# Patient Record
Sex: Female | Born: 1950 | Race: White | Hispanic: No | Marital: Married | Smoking: Current every day smoker
Health system: Southern US, Community
[De-identification: ages and names within clinical notes are randomized; demographics above are authoritative.]

---

## 2016-11-29 ENCOUNTER — Encounter (HOSPITAL_BASED_OUTPATIENT_CLINIC_OR_DEPARTMENT_OTHER): Payer: Self-pay | Admitting: Emergency Medicine

## 2016-11-29 ENCOUNTER — Emergency Department (HOSPITAL_BASED_OUTPATIENT_CLINIC_OR_DEPARTMENT_OTHER)
Admission: EM | Admit: 2016-11-29 | Discharge: 2016-11-29 | Disposition: A | Discharge status: Home / Self Care | Payer: PRIVATE HEALTH INSURANCE | Attending: Emergency Medicine | Admitting: Emergency Medicine

## 2016-11-29 ENCOUNTER — Emergency Department (HOSPITAL_BASED_OUTPATIENT_CLINIC_OR_DEPARTMENT_OTHER): Payer: PRIVATE HEALTH INSURANCE

## 2016-11-29 DIAGNOSIS — M25552 Pain in left hip: Secondary | ICD-10-CM | POA: Diagnosis present

## 2016-11-29 DIAGNOSIS — F17200 Nicotine dependence, unspecified, uncomplicated: Secondary | ICD-10-CM | POA: Insufficient documentation

## 2016-11-29 DIAGNOSIS — M5432 Sciatica, left side: Secondary | ICD-10-CM | POA: Insufficient documentation

## 2016-11-29 MED ORDER — PREDNISONE 10 MG PO TABS: 40.0000 mg | ORAL_TABLET | Freq: Every day | ORAL | 0 refills | Status: AC

## 2016-11-29 MED ORDER — NAPROXEN 250 MG PO TABS
375.0000 mg | ORAL_TABLET | Freq: Once | ORAL | Status: AC
  Administered 2016-11-29: 375 mg via ORAL
  Filled 2016-11-29: qty 2

## 2016-11-29 MED ORDER — PREDNISONE 20 MG PO TABS
40.0000 mg | ORAL_TABLET | Freq: Once | ORAL | Status: AC
  Administered 2016-11-29: 40 mg via ORAL
  Filled 2016-11-29: qty 2

## 2016-11-29 MED ORDER — NAPROXEN 375 MG PO TABS: 375.0000 mg | ORAL_TABLET | Freq: Two times a day (BID) | ORAL | 0 refills | Status: AC

## 2016-11-29 NOTE — ED Triage Notes (Signed)
Pt c/o left hip pain. Pt states she felt some pain in that hip earlier today at work and it was significantly worse when trying to get out of her car tonight.

## 2016-11-29 NOTE — ED Provider Notes (Signed)
[facility]   CSN: [id number] Arrival date & time: 11/29/16  2012  By signing my name below, I, Freida Busmaniana Omoyeni, attest that this documentation has been prepared under the direction and in the presence of Tilden FossaElizabeth Anan Dapolito, MD . Electronically Signed: Freida Busmaniana Omoyeni, Scribe. 11/29/2016. 10:48 PM   History   Chief Complaint Chief Complaint  Patient presents with  . Hip Pain    The history is provided by the patient. No language interpreter was used.     HPI Comments:  Tasha Cordova is a 66 y.o. female who presents to the Emergency Department complaining of pain to the left hip region which began this afternoon. She notes gradual onset while at work and states after almost 40 minute drive home from work today her pain significantly worsened. Pt denies injury. She also denies abdominal pain, nausea, vomiting, hematuria, and dysuria. No h/o similar pain.    No PCP  History reviewed. No pertinent past medical history.  There are no active problems to display for this patient.   History reviewed. No pertinent surgical history.  OB History    No data available       Home Medications    Prior to Admission medications   Medication Sig Start Date End Date Taking? Authorizing Provider  naproxen (NAPROSYN) 375 MG tablet Take 1 tablet (375 mg total) by mouth 2 (two) times daily. 11/29/16   Tilden FossaElizabeth Davyd Podgorski, MD  predniSONE (DELTASONE) 10 MG tablet Take 4 tablets (40 mg total) by mouth daily. 11/29/16   Tilden FossaElizabeth Kymberlyn Eckford, MD    Family History No family history on file.  Social History Social History  Substance Use Topics  . Smoking status: Current Every Day Smoker  . Smokeless tobacco: Never Used  . Alcohol use Yes     Allergies   Azo [phenazopyridine]   Review of Systems Review of Systems  Gastrointestinal: Negative for abdominal pain, nausea and vomiting.  Genitourinary: Negative for dysuria and hematuria.  Musculoskeletal: Positive for arthralgias  and myalgias.     Physical Exam Updated Vital Signs BP (!) 145/62 (BP Location: Right Arm)   Pulse 81   Temp 98.5 F (36.9 C) (Oral)   Resp 18   Ht 5' 4.5" (1.638 m)   Wt 133 lb (60.3 kg)   SpO2 98%   BMI 22.48 kg/m   Physical Exam  Constitutional: She is oriented to person, place, and time. She appears well-developed and well-nourished.  HENT:  Head: Normocephalic and atraumatic.  Cardiovascular: Normal rate and regular rhythm.   No murmur heard. Pulmonary/Chest: Effort normal and breath sounds normal. No respiratory distress.  Abdominal: Soft. There is no tenderness. There is no rebound and no guarding.  Musculoskeletal: She exhibits no edema or tenderness.  2+ DP pulses bilaterally. There is mild tenderness over the left SI joint. Pain is reproducible with extension of the left lower extremity and radiates to the left posterior knee.  Neurological: She is alert and oriented to person, place, and time.  5/5 strength in BLE with sensation to light touch intact in BLE.    Skin: Skin is warm and dry.  Psychiatric: She has a normal mood and affect. Her behavior is normal.  Nursing note and vitals reviewed.    ED Treatments / Results  DIAGNOSTIC STUDIES:  Oxygen Saturation is 98% on RA, normal by my interpretation.    COORDINATION OF CARE:  10:48 PM Discussed treatment plan with pt at bedside and pt agreed to plan.  Labs (all  labs ordered are listed, but only abnormal results are displayed) Labs Reviewed - No data to display  EKG  EKG Interpretation None       Radiology Dg Hip Unilat W Or Wo Pelvis 2-3 Views Left  Result Date: 11/29/2016 CLINICAL DATA:  Left hip pain, no known injury, initial encounter EXAM: DG HIP (WITH OR WITHOUT PELVIS) 3V LEFT COMPARISON:  None. FINDINGS: Pelvic ring is intact. No acute fracture or dislocation is noted. No soft tissue abnormality is seen. IMPRESSION: No acute abnormality noted. Electronically Signed   By: Alcide Clever M.D.    On: 11/29/2016 21:06    Procedures Procedures (including critical care time)  Medications Ordered in ED Medications  naproxen (NAPROSYN) tablet 375 mg (not administered)  predniSONE (DELTASONE) tablet 40 mg (40 mg Oral Given 11/29/16 2301)     Initial Impression / Assessment and Plan / ED Course  I have reviewed the triage vital signs and the nursing notes.  Pertinent labs & imaging results that were available during my care of the patient were reviewed by me and considered in my medical decision making (see chart for details).     Patient here for evaluation of atraumatic left hip pain. Pain does radiate down to her posterior knee with range of motion. Pain is reproducible on examination.  No red flag symptoms. Presentation is not consistent with dissection, epidural abscess, cauda equina, septic arthritis. Counseled pt on home care for sciatica with rest, PCP follow-up and return precautions.  Final Clinical Impressions(s) / ED Diagnoses   Final diagnoses:  Sciatica of left side    New Prescriptions New Prescriptions   NAPROXEN (NAPROSYN) 375 MG TABLET    Take 1 tablet (375 mg total) by mouth 2 (two) times daily.   PREDNISONE (DELTASONE) 10 MG TABLET    Take 4 tablets (40 mg total) by mouth daily.   I personally performed the services described in this documentation, which was scribed in my presence. The recorded information has been reviewed and is accurate.    Tilden Fossa, MD 11/29/16 2302

## 2016-11-29 NOTE — ED Triage Notes (Signed)
Called to triage with no answer from lobby.  

## 2017-09-03 IMAGING — DX DG HIP (WITH OR WITHOUT PELVIS) 2-3V*L*
3 series · 3 of 3 positions shown · non-contrast
Comparison: None.

CLINICAL DATA: Left hip pain, no known injury, initial encounter

EXAM:
DG HIP (WITH OR WITHOUT PELVIS) 3V LEFT

[pelvis ap]
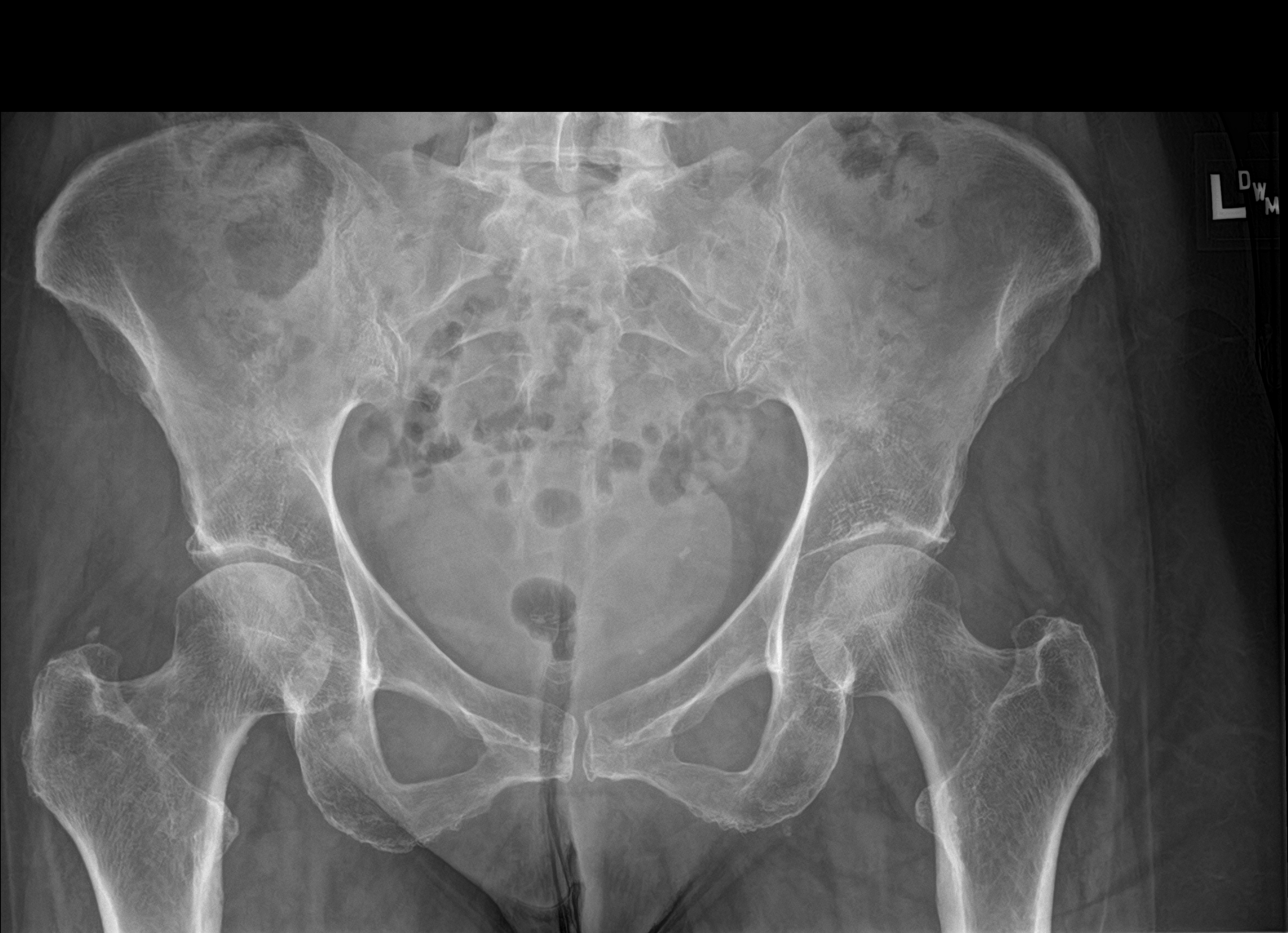

[hip ap]
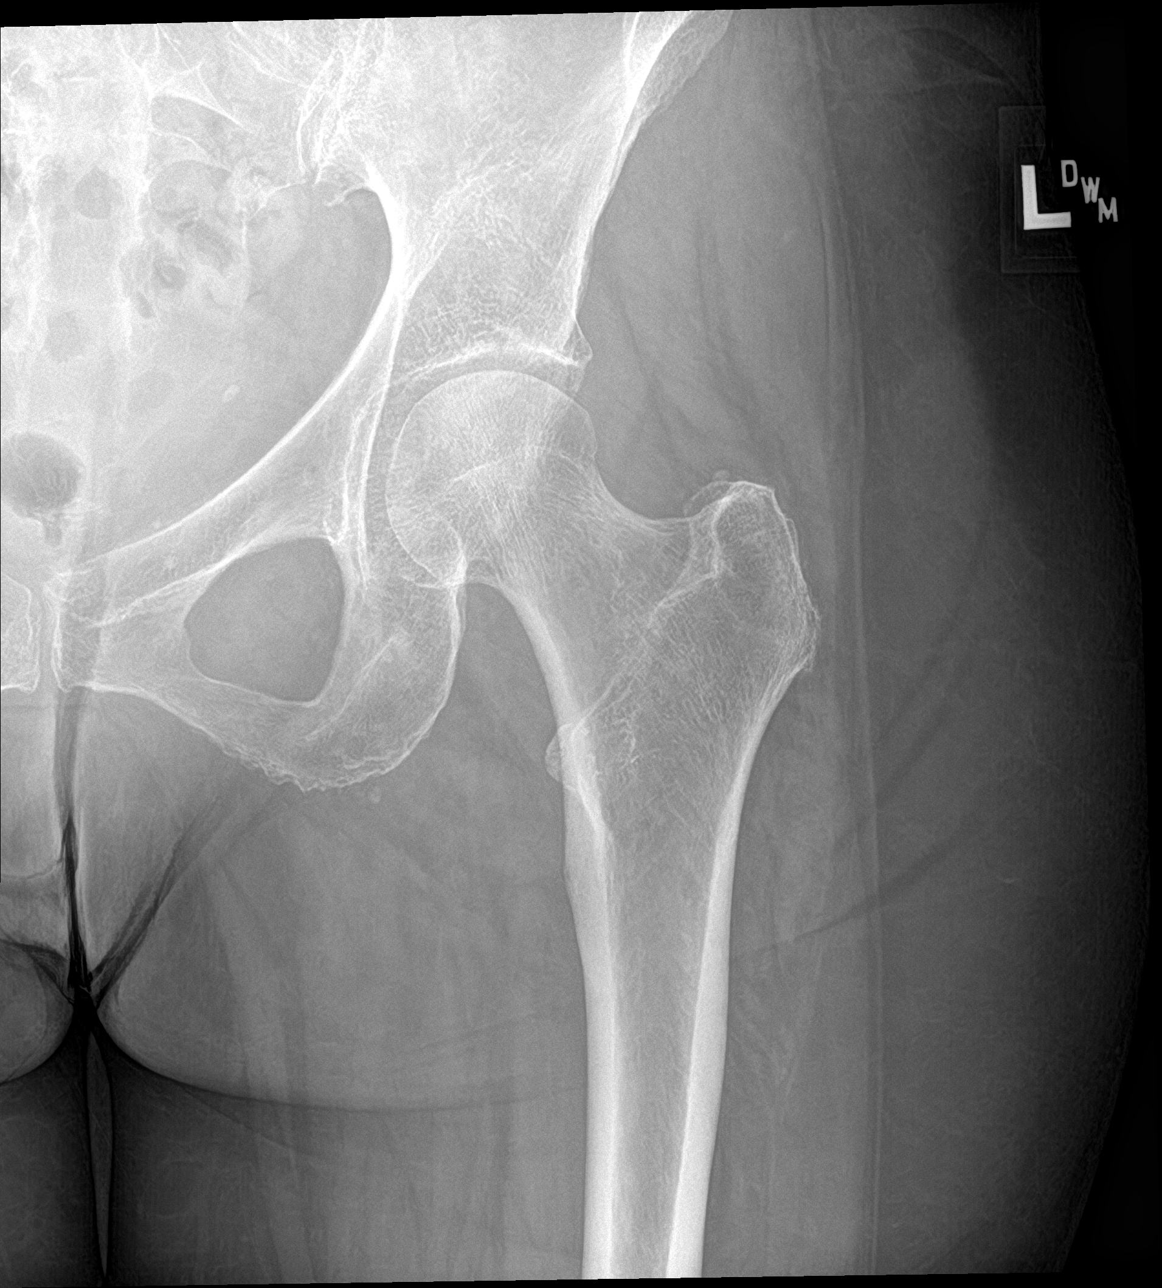

[hip lat]
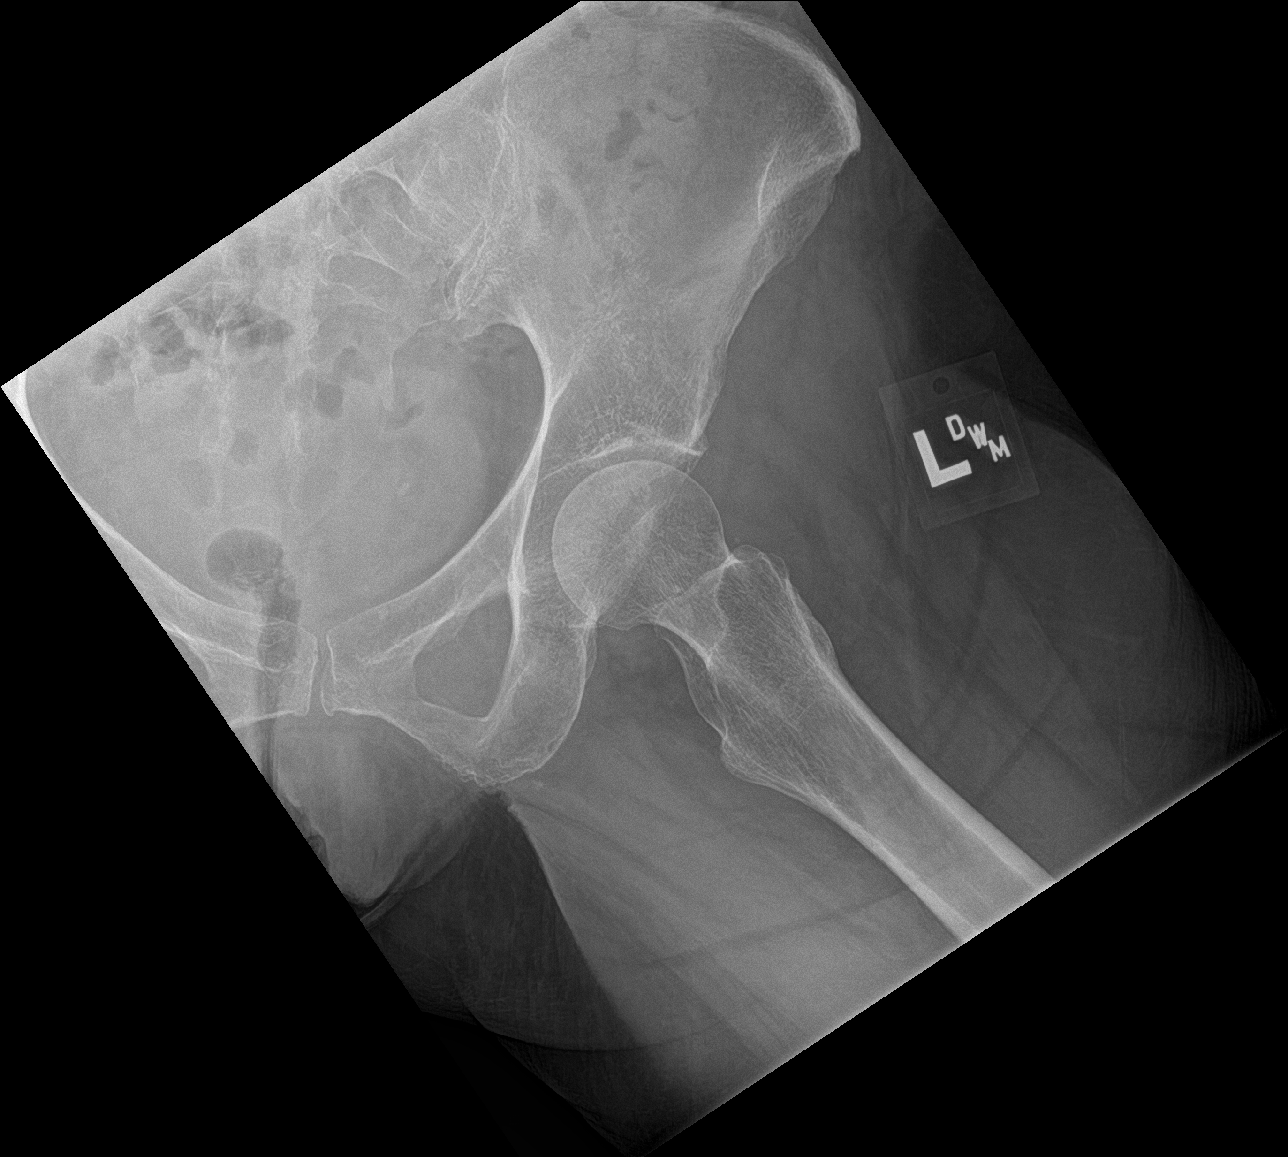

[3 of 3 positions shown; findings below may reference images not displayed]

FINDINGS: Pelvic ring is intact. No acute fracture or dislocation is noted. No
soft tissue abnormality is seen.
IMPRESSION: No acute abnormality noted.
